# Patient Record
Sex: Male | Born: 2000 | Race: White | Hispanic: Yes | Marital: Single | Smoking: Current some day smoker
Health system: Southern US, Community
[De-identification: ages and names within clinical notes are randomized; demographics above are authoritative.]

## PROBLEM LIST (undated history)

## (undated) DIAGNOSIS — R45851 Suicidal ideations: Secondary | ICD-10-CM

## (undated) DIAGNOSIS — I10 Essential (primary) hypertension: Secondary | ICD-10-CM

## (undated) DIAGNOSIS — J45909 Unspecified asthma, uncomplicated: Secondary | ICD-10-CM

## (undated) DIAGNOSIS — E7800 Pure hypercholesterolemia, unspecified: Secondary | ICD-10-CM

## (undated) DIAGNOSIS — F419 Anxiety disorder, unspecified: Secondary | ICD-10-CM

## (undated) DIAGNOSIS — F329 Major depressive disorder, single episode, unspecified: Secondary | ICD-10-CM

## (undated) DIAGNOSIS — F32A Depression, unspecified: Secondary | ICD-10-CM

---
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
## 2022-12-04 ENCOUNTER — Encounter (HOSPITAL_COMMUNITY): Payer: Self-pay

## 2022-12-04 ENCOUNTER — Ambulatory Visit (HOSPITAL_COMMUNITY)
Admission: EM | Admit: 2022-12-04 | Discharge: 2022-12-04 | Disposition: A | Discharge status: Home / Self Care | Payer: No Typology Code available for payment source | Attending: Urgent Care | Admitting: Urgent Care

## 2022-12-04 DIAGNOSIS — J111 Influenza due to unidentified influenza virus with other respiratory manifestations: Secondary | ICD-10-CM

## 2022-12-04 MED ORDER — PSEUDOEPHEDRINE HCL 60 MG PO TABS: 60.0000 mg | ORAL_TABLET | Freq: Three times a day (TID) | ORAL | 0 refills | Status: AC | PRN

## 2022-12-04 MED ORDER — OSELTAMIVIR PHOSPHATE 75 MG PO CAPS: 75.0000 mg | ORAL_CAPSULE | Freq: Two times a day (BID) | ORAL | 0 refills | Status: AC

## 2022-12-04 MED ORDER — IBUPROFEN 600 MG PO TABS: 600.0000 mg | ORAL_TABLET | Freq: Four times a day (QID) | ORAL | 0 refills | Status: AC | PRN

## 2022-12-04 MED ORDER — ACETAMINOPHEN 325 MG PO TABS: 650.0000 mg | ORAL_TABLET | Freq: Four times a day (QID) | ORAL | 0 refills | Status: AC | PRN

## 2022-12-04 MED ORDER — PROMETHAZINE-DM 6.25-15 MG/5ML PO SYRP: 5.0000 mL | ORAL_SOLUTION | Freq: Three times a day (TID) | ORAL | 0 refills | Status: AC | PRN

## 2022-12-04 MED ORDER — CETIRIZINE HCL 10 MG PO TABS: 10.0000 mg | ORAL_TABLET | Freq: Every day | ORAL | 0 refills | Status: AC

## 2022-12-04 NOTE — ED Triage Notes (Signed)
Pt states cough,fever and sore throat for the past 2 days, states his son was positive for flu.

## 2022-12-04 NOTE — Discharge Instructions (Addendum)
We will manage this as a viral illness like influenza by starting Tamiflu given the exposure you had from your son. For sore throat or cough try using a honey-based tea. Use 3 teaspoons of honey with juice squeezed from half lemon. Place shaved pieces of ginger into 1/2-1 cup of water and warm over stove top. Then mix the ingredients and repeat every 4 hours as needed. Please take ibuprofen 600mg  every 6 hours with food alternating with OR taken together with Tylenol 500mg -650mg  every 6 hours for throat pain, fevers, aches and pains. Hydrate very well with at least 2 liters of water. Eat light meals such as soups (chicken and noodles, vegetable, chicken and wild rice).  Do not eat foods that you are allergic to.  Taking an antihistamine like Zyrtec (10mg  daily) can help against postnasal drainage, sinus congestion which can cause sinus pain, sinus headaches, throat pain, painful swallowing, coughing.  You can take this together with pseudoephedrine (Sudafed) at a dose of 60 mg 3 times a day or twice daily as needed for the same kind of nasal drip, congestion.  Use cough medications as needed.

## 2022-12-04 NOTE — ED Provider Notes (Signed)
[facility]   MRN: [id number] DOB: 03/28/2001  Subjective:   Tony [name] is a 22 y.o. adult presenting for 2-day history of acute onset body pains, fever, throat pain, coughing, malaise and fatigue.  Patient has had exposure to rhinovirus and influenza from his children.  No history of asthma.  No chest pain, shortness of breath or wheezing.  He does have a history of asthma but is remote and has not needed an inhaler for some time.   No current medications.  No Known Allergies  Past Medical History:  Diagnosis Date   Anxiety    Asthma    Depression    High cholesterol    Hypertension    Suicidal ideations      History reviewed. No pertinent surgical history.  History reviewed. No pertinent family history.  Social History   Tobacco Use   Smoking status: Some Days    Types: Cigarettes   Smokeless tobacco: Never  Vaping Use   Vaping Use: Never used  Substance Use Topics   Alcohol use: Yes    Comment: 1 week ago-- 1 beer   Drug use: Yes    Types: Marijuana    ROS   Objective:   Vitals: BP 138/83 (BP Location: Left Arm)   Pulse 77   Temp 99.8 F (37.7 C) (Oral)   Resp 16   SpO2 96%   Physical Exam Constitutional:      General: He is not in acute distress.    Appearance: Normal appearance. He is well-developed and normal weight. He is not ill-appearing, toxic-appearing or diaphoretic.  HENT:     Head: Normocephalic and atraumatic.     Right Ear: Tympanic membrane, ear canal and external ear normal. No drainage or tenderness. No middle ear effusion. There is no impacted cerumen. Tympanic membrane is not erythematous or bulging.     Left Ear: Tympanic membrane, ear canal and external ear normal. No drainage or tenderness.  No middle ear effusion. There is no impacted cerumen. Tympanic membrane is not erythematous or bulging.     Nose: Congestion and rhinorrhea present.     Mouth/Throat:     Mouth: Mucous membranes are moist. No  oral lesions.     Pharynx: No pharyngeal swelling, oropharyngeal exudate, posterior oropharyngeal erythema or uvula swelling.     Tonsils: No tonsillar exudate or tonsillar abscesses.  Eyes:     General: No scleral icterus.       Right eye: No discharge.        Left eye: No discharge.     Extraocular Movements: Extraocular movements intact.     Right eye: Normal extraocular motion.     Left eye: Normal extraocular motion.     Conjunctiva/sclera: Conjunctivae normal.  Cardiovascular:     Rate and Rhythm: Normal rate and regular rhythm.     Heart sounds: Normal heart sounds. No murmur heard.    No friction rub. No gallop.  Pulmonary:     Effort: Pulmonary effort is normal. No respiratory distress.     Breath sounds: Normal breath sounds. No stridor. No wheezing, rhonchi or rales.  Musculoskeletal:     Cervical back: Normal range of motion and neck supple.  Lymphadenopathy:     Cervical: No cervical adenopathy.  Skin:    General: Skin is warm and dry.  Neurological:     General: No focal deficit present.     Mental Status: He is alert and oriented to person, place,  and time.  Psychiatric:        Mood and Affect: Mood normal.        Behavior: Behavior normal.        Thought Content: Thought content normal.     Assessment and Plan :   PDMP not reviewed this encounter.  1. Influenza     Deferred imaging given clear cardiopulmonary exam, hemodynamically stable vital signs. Does not meet [facility] criteria for strep testing.  Will cover for influenza with Tamiflu given exposure, symptom set, current incidence in the community.  Use supportive care, rest, fluids, hydration, light meals, schedule Tylenol and ibuprofen. Counseled patient on potential for adverse effects with medications prescribed today, patient verbalized understanding. ER and return-to-clinic precautions discussed, patient verbalized understanding.    Wallis Bamberg, [place] 12/04/22 1415
